# Patient Record
Sex: Female | Born: 2000 | Race: Black or African American | Hispanic: No | Marital: Single | Smoking: Never smoker
Health system: Southern US, Community
[De-identification: ages and names within clinical notes are randomized; demographics above are authoritative.]

## PROBLEM LIST (undated history)

## (undated) ENCOUNTER — Emergency Department (HOSPITAL_COMMUNITY): Discharge status: Against Medical Advice | Payer: No Typology Code available for payment source

## (undated) DIAGNOSIS — J45909 Unspecified asthma, uncomplicated: Secondary | ICD-10-CM

---
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
## 2022-07-07 ENCOUNTER — Emergency Department
Admission: EM | Admit: 2022-07-07 | Discharge: 2022-07-07 | Disposition: A | Discharge status: Home / Self Care | Payer: Self-pay | Attending: Emergency Medicine | Admitting: Emergency Medicine

## 2022-07-07 ENCOUNTER — Other Ambulatory Visit: Payer: Self-pay

## 2022-07-07 ENCOUNTER — Encounter: Payer: Self-pay | Admitting: Emergency Medicine

## 2022-07-07 ENCOUNTER — Emergency Department: Payer: Self-pay

## 2022-07-07 DIAGNOSIS — R079 Chest pain, unspecified: Secondary | ICD-10-CM | POA: Insufficient documentation

## 2022-07-07 DIAGNOSIS — R21 Rash and other nonspecific skin eruption: Secondary | ICD-10-CM | POA: Insufficient documentation

## 2022-07-07 DIAGNOSIS — J45909 Unspecified asthma, uncomplicated: Secondary | ICD-10-CM | POA: Insufficient documentation

## 2022-07-07 DIAGNOSIS — R231 Pallor: Secondary | ICD-10-CM | POA: Insufficient documentation

## 2022-07-07 HISTORY — DX: Unspecified asthma, uncomplicated: J45.909

## 2022-07-07 LAB — BASIC METABOLIC PANEL
Anion gap: 5 (ref 5–15)
BUN: 10 mg/dL (ref 6–20)
CO2: 24 mmol/L (ref 22–32)
Calcium: 9.1 mg/dL (ref 8.9–10.3)
Chloride: 110 mmol/L (ref 98–111)
Creatinine, Ser: 0.73 mg/dL (ref 0.44–1.00)
GFR, Estimated: >60 mL/min (ref >60)
Glucose, Bld: 93 mg/dL (ref 70–99)
Potassium: 3.6 mmol/L (ref 3.5–5.1)
Sodium: 139 mmol/L (ref 135–145)

## 2022-07-07 LAB — CBC
HCT: 34.6 % — ABNORMAL LOW (ref 36.0–46.0)
Hemoglobin: 11.5 g/dL — ABNORMAL LOW (ref 12.0–15.0)
MCH: 29.6 pg (ref 26.0–34.0)
MCHC: 33.2 g/dL (ref 30.0–36.0)
MCV: 88.9 fL (ref 80.0–100.0)
Platelets: 324 10*3/uL (ref 150–400)
RBC: 3.89 MIL/uL (ref 3.87–5.11)
RDW: 12.6 % (ref 11.5–15.5)
WBC: 4.3 10*3/uL (ref 4.0–10.5)
nRBC: 0 % (ref 0.0–0.2)

## 2022-07-07 LAB — POC URINE PREG, ED: Preg Test, Ur: NEGATIVE

## 2022-07-07 LAB — TROPONIN I (HIGH SENSITIVITY): Troponin I (High Sensitivity): <2 ng/L (ref <18)

## 2022-07-07 NOTE — Discharge Instructions (Signed)
Please follow-up with dermatology in regards to your rash.  Your cardiac work-up was reassuring including your cardiac enzymes were negative your EKG was normal and your chest x-ray did not have any abnormalities.  Please follow-up with primary care or return to emergency department for worsening or changing symptoms.

## 2022-07-07 NOTE — ED Provider Notes (Signed)
[facility] Provider Note    Event Date/Time   First MD Initiated Contact with Patient 07/07/22 1940     (approximate)   History   Chest Pain   HPI  Sherry Saunders is a 22 y.o. female with past medical history of asthma who presents with chest pain and lower extremity rash.  Patient notes any discoloration to the bilateral medial calf region that is been going on for about 3 months.  It will be more pronounced when she takes a shower and then resolves.  It is not itchy or painful.  No other rash no joint pain fevers chills.  She for about a week she has had sharp chest pain that is intermittent under left breast does not radiate is not associated with dyspnea, nausea.  Pain is not pleuritic or exertional or positional.  Occasionally radiates down the left arm with some associated numbness into the third and fourth digits.  She is not having pain or the arm symptoms now.The patient denies hx of prior DVT/PE, unilateral leg pain/swelling, hormone use, recent surgery, hx of cancer, prolonged immobilization, or hemoptysis.    Past Medical History:  Diagnosis Date   Asthma     There are no problems to display for this patient.    Physical Exam  Triage Vital Signs: ED Triage Vitals  Enc Vitals Group     BP 07/07/22 1928 117/71     Pulse Rate 07/07/22 1928 86     Resp 07/07/22 1928 16     Temp 07/07/22 1928 98.8 F (37.1 C)     Temp Source 07/07/22 1928 Oral     SpO2 07/07/22 1928 98 %     Weight 07/07/22 1930 115 lb (52.2 kg)     Height 07/07/22 1930 5\' 4"  (1.626 m)     Head Circumference --      Peak Flow --      Pain Score 07/07/22 1930 1     Pain Loc --      Pain Edu? --      Excl. in GC? --     Most recent vital signs: Vitals:   07/07/22 2002 07/07/22 2030  BP:  110/75  Pulse: 92 84  Resp: 19 16  Temp:    SpO2: 100% 100%     General: Awake, no distress.  CV:  Good peripheral perfusion.  No peripheral edema Resp:  Normal effort.   Lungs are clear no increased work of breathing Abd:  No distention.  Neuro:             Awake, Alert, Oriented x 3  Other:  Lacy reticular hyperpigmented rash that is restricted to the medial portion of the bilateral calves   ED Results / Procedures / Treatments  Labs (all labs ordered are listed, but only abnormal results are displayed) Labs Reviewed  CBC - Abnormal; Notable for the following components:      Result Value   Hemoglobin 11.5 (*)    HCT 34.6 (*)    All other components within normal limits  BASIC METABOLIC PANEL  POC URINE PREG, ED  TROPONIN I (HIGH SENSITIVITY)  TROPONIN I (HIGH SENSITIVITY)     EKG  EKG interpretation performed by myself: NSR, nml axis, nml intervals, no acute ischemic changes    RADIOLOGY    PROCEDURES:  Critical Care performed: No  .1-3 Lead EKG Interpretation  Performed by: 09/07/22, MD Authorized by: Georga Hacking, MD  Interpretation: normal     ECG rate assessment: normal     Rhythm: sinus rhythm     Ectopy: none     Conduction: normal     The patient is on the cardiac monitor to evaluate for evidence of arrhythmia and/or significant heart rate changes.   MEDICATIONS ORDERED IN ED: Medications - No data to display   IMPRESSION / MDM / ASSESSMENT AND PLAN / ED COURSE  I reviewed the triage vital signs and the nursing notes.                              Patient's presentation is most consistent with acute presentation with potential threat to life or bodily function.  Differential diagnosis includes, but is not limited to, ACS, pleurisy, myocarditis, pericarditis, pulmonary embolism  Patient is a 22 year old female presenting with chest pain x1 week and lower extremity rash x3 months.  The lower extremity rash to me looks like livedo reticularis patient has no other systemic symptoms to suggest systemic illness including no fevers joint pain no history of autoimmune disease although this could be  first presentation.  This has been stable for 3 months and do not feel it is life-threatening will refer to dermatology.  Terms of the chest pain this has been going on for 1 week it is intermittent not positional pleuritic or exertional.  She does not have it currently.  She has really minimal associated symptoms occasionally radiates down the left arm.  No cardiac risk factors.  EKG is nonischemic.  Will obtain chest x-ray labs including cardiac enzymes.  Considered pulmonary embolism but patient is PERC negative think this less likely.  Troponins negative labs are otherwise reassuring including CBC and BMP.  Patient is quite sure she cannot be pregnant so will defer hCG as patient does not provide a urine sample.  Will refer to dermatology discussed primary care follow-up and return to the ED for worsening symptoms.       FINAL CLINICAL IMPRESSION(S) / ED DIAGNOSES   Final diagnoses:  Chest pain, unspecified type  Livedo reticularis     Rx / DC Orders   ED Discharge Orders     None        Note:  This document was prepared using Dragon voice recognition software and may include unintentional dictation errors.   Georga Hacking, MD 07/07/22 2108

## 2022-07-07 NOTE — ED Triage Notes (Signed)
Pt reports chest pain for the past 2 days that radiates to her 2 middle fingers. Pt reports chest pain as a cramp, and shooting pain to her left fingers. Pt also concerned as today she developed some discoloration to her lower extremities. Pt talks in complete sentences no respiratory distress noted

## 2022-12-11 ENCOUNTER — Emergency Department: Payer: BC Managed Care – PPO

## 2022-12-11 ENCOUNTER — Emergency Department
Admission: EM | Admit: 2022-12-11 | Discharge: 2022-12-11 | Disposition: A | Discharge status: Home / Self Care | Payer: BC Managed Care – PPO | Attending: Emergency Medicine | Admitting: Emergency Medicine

## 2022-12-11 ENCOUNTER — Other Ambulatory Visit: Payer: Self-pay

## 2022-12-11 DIAGNOSIS — W228XXA Striking against or struck by other objects, initial encounter: Secondary | ICD-10-CM | POA: Insufficient documentation

## 2022-12-11 DIAGNOSIS — M79675 Pain in left toe(s): Secondary | ICD-10-CM | POA: Diagnosis not present

## 2022-12-11 NOTE — ED Provider Notes (Signed)
   [facility] Provider Note    Event Date/Time   First MD Initiated Contact with Patient 12/11/22 [phone]     (approximate)   History   Toe Pain (Left)   HPI  Sherry Saunders is a 22 y.o. female  who presents to the emergency department today because of concern for left fifth toe pain. Patient states she ran into a doorway last night. Is having significant pain especially with movement and weight bearing. Patient denies any other significant injury.       Physical Exam   Triage Vital Signs: ED Triage Vitals  Enc Vitals Group     BP 12/11/22 0938 114/67     Pulse Rate 12/11/22 0938 83     Resp 12/11/22 0938 18     Temp 12/11/22 0938 98.6 F (37 C)     Temp Source 12/11/22 0938 Oral     SpO2 12/11/22 0938 100 %     Weight 12/11/22 0936 110 lb (49.9 kg)     Height 12/11/22 0936 5\' 4"  (1.626 m)     Head Circumference --      Peak Flow --      Pain Score 12/11/22 0936 8     Pain Loc --      Pain Edu? --      Excl. in GC? --     Most recent vital signs: Vitals:   12/11/22 0938  BP: 114/67  Pulse: 83  Resp: 18  Temp: 98.6 F (37 C)  SpO2: 100%   General: Awake, alert, oriented. CV:  Good peripheral perfusion.  Resp:  Normal effort.  Abd:  No distention.  Ext:  Left fifth toe without significant swelling. No discoloration.   ED Results / Procedures / Treatments   Labs (all labs ordered are listed, but only abnormal results are displayed) Labs Reviewed - No data to display   EKG  None   RADIOLOGY I independently interpreted and visualized the left fifth toe x-ray. My interpretation: No acute osseous abnormality Radiology interpretation:  IMPRESSION:  No definite fracture. One could question a tiny fracture at the  proximal corner of the distal phalanx, but this may simply be  attributable to trabeculation.     PROCEDURES:  Critical Care performed: No  Procedures   MEDICATIONS ORDERED IN ED: Medications - No data to  display   IMPRESSION / MDM / ASSESSMENT AND PLAN / ED COURSE  I reviewed the triage vital signs and the nursing notes.                              Differential diagnosis includes, but is not limited to, fracture, dislocation, soft tissue injury.  Patient's presentation is most consistent with acute presentation with potential threat to life or bodily function.  Patient presented to the emergency department today because of concern for left fifth toe pain. X-ray without any concerning findings (question small fracture, however have low suspicion given pain is mostly at the base of the toe). Did discuss x-ray with patient. Will plan on putting in post op shoe. Will discharge.   FINAL CLINICAL IMPRESSION(S) / ED DIAGNOSES   Final diagnoses:  Pain of toe of left foot        Note:  This document was prepared using Dragon voice recognition software and may include unintentional dictation errors.    14/12/23, MD 12/11/22 [phone]

## 2022-12-11 NOTE — ED Notes (Signed)
See triage note.  Presents with pain to left foot  States she hit the wall  Having pain to left 5th toe

## 2022-12-11 NOTE — ED Triage Notes (Signed)
Pt to ED via POV from home. Pt ambulatory to triage room. Pt reports she ran into the wall and it hit the left pinky. Pt reports when she tried to move it causes a sharp pain.

## 2023-08-11 ENCOUNTER — Emergency Department
Admission: EM | Admit: 2023-08-11 | Discharge: 2023-08-11 | Discharge status: Against Medical Advice | Payer: Self-pay | Attending: Emergency Medicine | Admitting: Emergency Medicine

## 2023-08-11 ENCOUNTER — Other Ambulatory Visit: Payer: Self-pay

## 2023-08-11 ENCOUNTER — Emergency Department: Payer: Self-pay

## 2023-08-11 DIAGNOSIS — Z5321 Procedure and treatment not carried out due to patient leaving prior to being seen by health care provider: Secondary | ICD-10-CM | POA: Insufficient documentation

## 2023-08-11 DIAGNOSIS — R079 Chest pain, unspecified: Secondary | ICD-10-CM | POA: Insufficient documentation

## 2023-08-11 LAB — CBC
HCT: 36.2 % (ref 36.0–46.0)
Hemoglobin: 12.4 g/dL (ref 12.0–15.0)
MCH: 30.5 pg (ref 26.0–34.0)
MCHC: 34.3 g/dL (ref 30.0–36.0)
MCV: 88.9 fL (ref 80.0–100.0)
Platelets: 376 10*3/uL (ref 150–400)
RBC: 4.07 MIL/uL (ref 3.87–5.11)
RDW: 12.4 % (ref 11.5–15.5)
WBC: 8.4 10*3/uL (ref 4.0–10.5)
nRBC: 0 % (ref 0.0–0.2)

## 2023-08-11 LAB — BASIC METABOLIC PANEL
Anion gap: 9 (ref 5–15)
BUN: 10 mg/dL (ref 6–20)
CO2: 21 mmol/L — ABNORMAL LOW (ref 22–32)
Calcium: 9 mg/dL (ref 8.9–10.3)
Chloride: 105 mmol/L (ref 98–111)
Creatinine, Ser: 0.87 mg/dL (ref 0.44–1.00)
GFR, Estimated: >60 mL/min (ref >60)
Glucose, Bld: 145 mg/dL — ABNORMAL HIGH (ref 70–99)
Potassium: 2.8 mmol/L — ABNORMAL LOW (ref 3.5–5.1)
Sodium: 135 mmol/L (ref 135–145)

## 2023-08-11 LAB — TROPONIN I (HIGH SENSITIVITY): Troponin I (High Sensitivity): 3 ng/L (ref <18)

## 2023-08-11 NOTE — ED Notes (Signed)
Blue top and red top sent down.

## 2023-08-11 NOTE — ED Triage Notes (Addendum)
Pt to ed from home via [facility]. Pt was at home and felt her heart began to race and had some chest pain. Pt was hyperventilating on scene. Pt has HX of anxiety and depression and is medicated for same. HR 165 for EMS.  18G LAC Pt is caox4, in no acute distress and in a wheel chair in triage. Pt appears to be under influence of drugs but pt denies.

## 2024-03-13 ENCOUNTER — Ambulatory Visit
Admission: RE | Admit: 2024-03-13 | Discharge: 2024-03-13 | Disposition: A | Discharge status: Home / Self Care | Source: Ambulatory Visit

## 2024-03-13 VITALS — BP 117/76 | HR 96 | Temp 99.6°F | Resp 14 | Ht 64.0 in | Wt 110.0 lb

## 2024-03-13 DIAGNOSIS — R222 Localized swelling, mass and lump, trunk: Secondary | ICD-10-CM | POA: Diagnosis not present

## 2024-03-13 DIAGNOSIS — T148XXA Other injury of unspecified body region, initial encounter: Secondary | ICD-10-CM | POA: Diagnosis not present

## 2024-03-13 NOTE — Discharge Instructions (Signed)
 Please take OTC Tylenol /Aleve as label directed for pain  Follow-up with PCP next week for recheck, may need mammogram/US if pain/swelling persists  Go to ER for new or worsening issues or concerns

## 2024-03-13 NOTE — ED Provider Notes (Signed)
 [facility]    CSN: [id number] Arrival date & time: 03/13/24  1730      History   Chief Complaint Chief Complaint  Patient presents with   Breast Mass    left    HPI Sherry Saunders is a 24 y.o. female.   24 year old female, Sherry Saunders, presents to urgent care for evaluation of lump at the top of her left breast for the past 3 days.  Patient states that she is about to start her menstrual cycle so has some general breast tenderness, patient also reports she had a pulled muscle in left side of chest recently.  Patient denies any relatives with breast cancer, states she had a physical last month with breast exam and did not notice any lump at that time.  Patient denies smoking, alcohol , or drug use, pt does drink caffeine  The history is provided by the patient. No language interpreter was used.    Past Medical History:  Diagnosis Date   Asthma     Patient Active Problem List   Diagnosis Date Noted   Mass of chest wall, left 03/13/2024   Muscle strain 03/13/2024    History reviewed. No pertinent surgical history.  OB History   No obstetric history on file.      Home Medications    Prior to Admission medications   Medication Sig Start Date End Date Taking? Authorizing Provider  albuterol (PROVENTIL HFA;VENTOLIN HFA) 108 (90 BASE) MCG/ACT inhaler Inhale 1 puff into the lungs every 6 (six) hours as needed for wheezing or shortness of breath.    [provider]  ibuprofen (ADVIL,MOTRIN) 200 MG tablet Take 200 mg by mouth every 6 (six) hours as needed for fever.    [provider]  ondansetron (ZOFRAN ODT) 4 MG disintegrating tablet 2mg  ODT q4 hours prn vomiting 03/13/14   Ivonne Andrew, PA-C    Family History History reviewed. No pertinent family history.  Social History Social History   Tobacco Use   Smoking status: Never   Smokeless tobacco: Never  Vaping Use   Vaping status: Never Used  Substance Use Topics   Alcohol  use: No   Drug use: Never     Allergies   Diflucan [fluconazole]   Review of Systems Review of Systems  Constitutional:  Negative for fever.  Respiratory:  Negative for cough.   Cardiovascular:  Negative for chest pain, palpitations and leg swelling.  Musculoskeletal:  Positive for myalgias.  Skin: Negative.   All other systems reviewed and are negative.    Physical Exam Triage Vital Signs ED Triage Vitals  Encounter Vitals Group     BP      Systolic BP Percentile      Diastolic BP Percentile      Pulse      Resp      Temp      Temp src      SpO2      Weight      Height      Head Circumference      Peak Flow      Pain Score      Pain Loc      Pain Education      Exclude from Growth Chart    No data found.  Updated Vital Signs BP 117/76 (BP Location: Right Arm)   Pulse 96   Temp 99.6 F (37.6 C) (Oral)   Resp 14   Ht 5\' 4"  (1.626 m)  Wt 110 lb 0.2 oz (49.9 kg)   LMP 02/10/2024 (Approximate)   SpO2 99%   BMI 18.88 kg/m   Visual Acuity Right Eye Distance:   Left Eye Distance:   Bilateral Distance:    Right Eye Near:   Left Eye Near:    Bilateral Near:     Physical Exam Vitals and nursing note reviewed. Exam conducted with a chaperone present.  Constitutional:      Appearance: Normal appearance. She is well-developed and well-groomed.  HENT:     Head: Normocephalic.  Neck:     Trachea: Trachea normal.  Cardiovascular:     Rate and Rhythm: Normal rate.  Pulmonary:     Effort: Pulmonary effort is normal.  Chest:     Chest wall: Mass present.  Breasts:    Right: No inverted nipple, nipple discharge or skin change.     Left: No inverted nipple, nipple discharge or skin change.       Comments: Bilateral barbell nipple piercing +TTP,swelling as charted Musculoskeletal:     Cervical back: Normal range of motion.  Neurological:     General: No focal deficit present.     Mental Status: She is alert and oriented to person, place, and time.      GCS: GCS eye subscore is 4. GCS verbal subscore is 5. GCS motor subscore is 6.  Psychiatric:        Attention and Perception: Attention normal.        Mood and Affect: Mood is anxious.        Speech: Speech normal.        Behavior: Behavior normal. Behavior is cooperative.      UC Treatments / Results  Labs (all labs ordered are listed, but only abnormal results are displayed) Labs Reviewed - No data to display  EKG   Radiology No results found.  Procedures Procedures (including critical care time)  Medications Ordered in UC Medications - No data to display  Initial Impression / Assessment and Plan / UC Course  I have reviewed the triage vital signs and the nursing notes.  Pertinent labs & imaging results that were available during my care of the patient were reviewed by me and considered in my medical decision making (see chart for details).    Discussed exam findings and plan of care with patient, strict go to ER precautions given.   Patient verbalized understanding to this provider.  Ddx: Left chest wall mass, muscle strain, pectoralis muscle tear, fibrocystic breast changes Final Clinical Impressions(s) / UC Diagnoses   Final diagnoses:  Mass of chest wall, left  Muscle strain     Discharge Instructions      Please take OTC Tylenol /Aleve as label directed for pain  Follow-up with PCP next week for recheck, may need mammogram/US if pain/swelling persists  Go to ER for new or worsening issues or concerns     ED Prescriptions   None    PDMP not reviewed this encounter.   Clancy Gourd, NP 03/13/24 1821

## 2024-03-13 NOTE — ED Triage Notes (Signed)
 Patient reports a lump at the top of her left breast for the past 3 days.  Patient states that she is about to started her menstrual cycle.  Patient states that the lump is not tender.

## 2024-10-18 ENCOUNTER — Other Ambulatory Visit: Payer: Self-pay

## 2024-10-18 ENCOUNTER — Emergency Department
Admission: EM | Admit: 2024-10-18 | Discharge: 2024-10-18 | Disposition: A | Discharge status: Home / Self Care | Attending: Emergency Medicine | Admitting: Emergency Medicine

## 2024-10-18 ENCOUNTER — Emergency Department

## 2024-10-18 DIAGNOSIS — R0789 Other chest pain: Secondary | ICD-10-CM | POA: Diagnosis present

## 2024-10-18 DIAGNOSIS — J45909 Unspecified asthma, uncomplicated: Secondary | ICD-10-CM | POA: Diagnosis not present

## 2024-10-18 DIAGNOSIS — R079 Chest pain, unspecified: Secondary | ICD-10-CM

## 2024-10-18 LAB — BASIC METABOLIC PANEL WITH GFR
Anion gap: 14 (ref 5–15)
BUN: 11 mg/dL (ref 6–20)
CO2: 22 mmol/L (ref 22–32)
Calcium: 9.6 mg/dL (ref 8.9–10.3)
Chloride: 104 mmol/L (ref 98–111)
Creatinine, Ser: 0.9 mg/dL (ref 0.44–1.00)
GFR, Estimated: >60 mL/min (ref >60)
Glucose, Bld: 116 mg/dL — ABNORMAL HIGH (ref 70–99)
Potassium: 3.5 mmol/L (ref 3.5–5.1)
Sodium: 140 mmol/L (ref 135–145)

## 2024-10-18 LAB — D-DIMER, QUANTITATIVE: D-Dimer, Quant: 0.4 ug{FEU}/mL (ref 0.00–0.50)

## 2024-10-18 LAB — CBC
HCT: 39.2 % (ref 36.0–46.0)
Hemoglobin: 13.4 g/dL (ref 12.0–15.0)
MCH: 31 pg (ref 26.0–34.0)
MCHC: 34.2 g/dL (ref 30.0–36.0)
MCV: 90.7 fL (ref 80.0–100.0)
Platelets: 416 K/uL — ABNORMAL HIGH (ref 150–400)
RBC: 4.32 MIL/uL (ref 3.87–5.11)
RDW: 12.1 % (ref 11.5–15.5)
WBC: 5.9 K/uL (ref 4.0–10.5)
nRBC: 0 % (ref 0.0–0.2)

## 2024-10-18 LAB — HCG, QUANTITATIVE, PREGNANCY: hCG, Beta Chain, Quant, S: <1 m[IU]/mL (ref <5)

## 2024-10-18 LAB — TROPONIN I (HIGH SENSITIVITY): Troponin I (High Sensitivity): <2 ng/L (ref <18)

## 2024-10-18 MED ORDER — ALUM & MAG HYDROXIDE-SIMETH 200-200-20 MG/5ML PO SUSP
30.0000 mL | Freq: Once | ORAL | Status: AC
  Administered 2024-10-18: 30 mL via ORAL
  Filled 2024-10-18: qty 30

## 2024-10-18 MED ORDER — LIDOCAINE VISCOUS HCL 2 % MT SOLN
15.0000 mL | Freq: Once | OROMUCOSAL | Status: AC
  Administered 2024-10-18: 15 mL via ORAL
  Filled 2024-10-18: qty 15

## 2024-10-18 MED ORDER — HYDROXYZINE HCL 25 MG PO TABS: 25.0000 mg | ORAL_TABLET | Freq: Once | ORAL | Status: DC

## 2024-10-18 MED ORDER — FAMOTIDINE 20 MG PO TABS: 20.0000 mg | ORAL_TABLET | Freq: Two times a day (BID) | ORAL | 0 refills | Status: AC

## 2024-10-18 NOTE — ED Provider Notes (Signed)
 Sherry Saunders Provider Note    Event Date/Time   First MD Initiated Contact with Patient 10/18/24 2157     (approximate)   History   Chest Pain   HPI  Sherry Saunders is a 24 y.o. female with history of asthma, anxiety, OCD, presenting with chest pain.  Has been intermittent for the last 3 to 4 days.  Patient states that she stopped taking her Ritalin 2 days ago, states that the chest pain is still been left-sided a cramping/burning with intermittent left arm heaviness.  Not exertional, not positional.  No other symptoms.  No recent travel or surgeries, no shortness of breath or cough.  No leg swelling.  No known any hormones or OCPs, no history of blood clots or malignancy.  States that she thinks that she was quite anxious when she came into the emergency department hence her initial heart rate was high.  Feels calmer right now.  No prior cardiac history.    On independent chart review, she was seen by primary care on October 16, at that time was complaining about chest pressure and tingling sensation to her left arm and chest.  Has history of anxiety and OCD, symptoms, on when her Ritalin wear off.  Psychiatry has been trying to get Ritalin extended release approved.  Physical Exam   Triage Vital Signs: ED Triage Vitals  Encounter Vitals Group     BP 10/18/24 2127 (!) 134/92     Girls Systolic BP Percentile --      Girls Diastolic BP Percentile --      Boys Systolic BP Percentile --      Boys Diastolic BP Percentile --      Pulse Rate 10/18/24 2127 (!) 115     Resp 10/18/24 2127 18     Temp 10/18/24 2127 98.6 F (37 C)     Temp Source 10/18/24 2127 Oral     SpO2 10/18/24 2127 100 %     Weight 10/18/24 2129 117 lb (53.1 kg)     Height 10/18/24 2129 5' 4 (1.626 m)     Head Circumference --      Peak Flow --      Pain Score 10/18/24 2128 6     Pain Loc --      Pain Education --      Exclude from Growth Chart --     Most recent vital  signs: Vitals:   10/18/24 2127 10/18/24 2241  BP: (!) 134/92 118/81  Pulse: (!) 115 86  Resp: 18   Temp: 98.6 F (37 C)   SpO2: 100% 100%     General: Awake, no distress.  CV:  Good peripheral perfusion.  Resp:  Normal effort.  No thoracic cage tenderness, no respiratory distress Abd:  No distention.  Soft nontender Other:  No unilateral calf swelling or Tenderness.   ED Results / Procedures / Treatments   Labs (all labs ordered are listed, but only abnormal results are displayed) Labs Reviewed  BASIC METABOLIC PANEL WITH GFR - Abnormal; Notable for the following components:      Result Value   Glucose, Bld 116 (*)    All other components within normal limits  CBC - Abnormal; Notable for the following components:   Platelets 416 (*)    All other components within normal limits  HCG, QUANTITATIVE, PREGNANCY  D-DIMER, QUANTITATIVE  TROPONIN I (HIGH SENSITIVITY)     EKG  EKG shows, sinus tachycardia, rate 119, normal QS,  normal QTc, no obvious ischemic ST elevation, T wave inversions in 3, T wave inversions new compared to prior   RADIOLOGY On my independent interpretation, chest x-ray without obvious consolidation   PROCEDURES:  Critical Care performed: No  Procedures   MEDICATIONS ORDERED IN ED: Medications  alum & mag hydroxide-simeth (MAALOX/MYLANTA) 200-200-20 MG/5ML suspension 30 mL (30 mLs Oral Given 10/18/24 2222)    And  lidocaine (XYLOCAINE) 2 % viscous mouth solution 15 mL (15 mLs Oral Given 10/18/24 2222)     IMPRESSION / MDM / ASSESSMENT AND PLAN / ED COURSE  I reviewed the triage vital signs and the nursing notes.                              Differential diagnosis includes, but is not limited to, anxiety, arrhythmia, atypical ACS, costochondritis, musculoskeletal pain, strain, GERD, acid reflux.  Did consider PE but patient has no other risk factors for it, not hypoxic or tachypneic.  Labs, EKG, troponin, D-dimer, chest x-ray.  GI  cocktail  Patient's presentation is most consistent with acute presentation with potential threat to life or bodily function.  Independent interpretation of labs and imaging below.  Labs are reassuring.  Chest burning improved with medications.  Will start her on Pepcid.  Considered but no indication for inpatient admission at this time, she safe for outpatient management.  Will discharge with instructions to follow-up with her primary care doctor for further management of her symptoms as well as her psychiatrist to discuss her antianxiety medications.  Shared decision making by patient and she is agreeable with this plan.  Discharge with strict return precautions.  The patient is on the cardiac monitor to evaluate for evidence of arrhythmia and/or significant heart rate changes.   Clinical Course as of 10/18/24 2254  Sun Oct 18, 2024  2200 DG Chest 2 View 1. No acute intrathoracic process.  [TT]  2237 Independent review of labs, troponin is not elevated, electrolytes not severely deranged, no leukocytosis, D-dimer is not elevated. [TT]  2248 Pulse Rate: 86 Tachycardia resolved. [TT]  2250 HCG, Beta Chain, Quant, S: <1 Not elevated. [TT]    Clinical Course User Index [TT] Waymond Lorelle Cummins, MD     FINAL CLINICAL IMPRESSION(S) / ED DIAGNOSES   Final diagnoses:  Chest pain, unspecified type  Burning chest pain     Rx / DC Orders   ED Discharge Orders          Ordered    famotidine (PEPCID) 20 MG tablet  2 times daily        10/18/24 2250             Note:  This document was prepared using Dragon voice recognition software and may include unintentional dictation errors.    Waymond Lorelle Cummins, MD 10/18/24 [phone]

## 2024-10-18 NOTE — Discharge Instructions (Signed)
 Please make sure to follow-up with your primary care doctor next week to get reassessed, please discuss with her psychiatrist about further management of your Ritalin and antianxiety medications.  Please be sure to keep her self hydrated.

## 2024-10-18 NOTE — ED Triage Notes (Signed)
 Pt presented to ED POV with c/o intermittent, dull, cramping, left sided chest pain x 3-4 days. States was seen by PCP on Thursday and EKG was done. Pt also states 2 episodes of pain radiating down left arm. Pt states started ritalin 2 weeks ago.
